# Patient Record
Sex: Male | Born: 2000 | Race: White | Hispanic: Yes | Marital: Single | Smoking: Current some day smoker
Health system: Southern US, Community
[De-identification: ages and names within clinical notes are randomized; demographics above are authoritative.]

---
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
## 2022-01-03 ENCOUNTER — Ambulatory Visit (HOSPITAL_COMMUNITY): Payer: Self-pay

## 2022-01-03 ENCOUNTER — Encounter (HOSPITAL_COMMUNITY): Payer: Self-pay | Admitting: *Deleted

## 2022-01-03 ENCOUNTER — Ambulatory Visit (HOSPITAL_COMMUNITY)
Admission: EM | Admit: 2022-01-03 | Discharge: 2022-01-03 | Disposition: A | Discharge status: Home / Self Care | Payer: Medicaid Other | Attending: Internal Medicine | Admitting: Internal Medicine

## 2022-01-03 ENCOUNTER — Other Ambulatory Visit: Payer: Self-pay

## 2022-01-03 DIAGNOSIS — S61431A Puncture wound without foreign body of right hand, initial encounter: Secondary | ICD-10-CM | POA: Diagnosis not present

## 2022-01-03 DIAGNOSIS — S6991XA Unspecified injury of right wrist, hand and finger(s), initial encounter: Secondary | ICD-10-CM | POA: Diagnosis not present

## 2022-01-03 DIAGNOSIS — Z23 Encounter for immunization: Secondary | ICD-10-CM | POA: Diagnosis not present

## 2022-01-03 MED ORDER — TETANUS-DIPHTH-ACELL PERTUSSIS 5-2.5-18.5 LF-MCG/0.5 IM SUSY
PREFILLED_SYRINGE | INTRAMUSCULAR | Status: AC
  Filled 2022-01-03: qty 0.5

## 2022-01-03 MED ORDER — TETANUS-DIPHTH-ACELL PERTUSSIS 5-2.5-18.5 LF-MCG/0.5 IM SUSY
0.5000 mL | PREFILLED_SYRINGE | Freq: Once | INTRAMUSCULAR | Status: AC
  Administered 2022-01-03: 0.5 mL via INTRAMUSCULAR

## 2022-01-03 NOTE — Discharge Instructions (Signed)
Monitor for signs of infection such as swelling, redness around the wound, and drainage from the wound. Keep the wound clean and dry to allow it to heal. We updated your tetanus shot today.  If you develop any new or worsening symptoms or do not improve in the next 2 to 3 days, please return.  If your symptoms are severe, please go to the emergency room.  Follow-up with your primary care provider for further evaluation and management of your symptoms as well as ongoing wellness visits.  I hope you feel better!

## 2022-01-03 NOTE — ED Provider Notes (Signed)
[facility]    CSN: [id number] Arrival date & time: 01/03/22  1021      History   Chief Complaint Chief Complaint  Patient presents with   Hand Injury    HPI Jose Cooper is a 21 y.o. male.   Patient presents urgent care for evaluation of puncture wound to the palm of the right hand that happened 2 days ago while he was at work.  He states that he was using a baby screwdriver when he accidentally struck the palm of his right hand causing a puncture wound.  He has full range of motion of the right hand and states the injury blood initially but bleeding was controlled with pressure.  Sensation intact distal to injury.  States the screwdriver was very rusty and would like an updated tetanus shot as he does not know when his last one was.  Denies previous injury to the right hand and foreign body to the right hand.  Denies signs of infection.  No fever or chills.     History reviewed. No pertinent past medical history.  There are no problems to display for this patient.   History reviewed. No pertinent surgical history.     Home Medications    Prior to Admission medications   Not on File    Family History History reviewed. No pertinent family history.  Social History Social History   Tobacco Use   Smoking status: Some Days    Types: Cigarettes   Smokeless tobacco: Never     Allergies   Patient has no known allergies.   Review of Systems Review of Systems Per HPI  Physical Exam Triage Vital Signs ED Triage Vitals  Enc Vitals Group     BP 01/03/22 1058 (!) 116/53     Pulse Rate 01/03/22 1058 65     Resp 01/03/22 1058 18     Temp 01/03/22 1058 98.7 F (37.1 C)     Temp src --      SpO2 01/03/22 1058 96 %     Weight --      Height --      Head Circumference --      Peak Flow --      Pain Score 01/03/22 1055 4     Pain Loc --      Pain Edu? --      Excl. in GC? --    No data found.  Updated Vital Signs BP (!) 116/53   Pulse  65   Temp 98.7 F (37.1 C)   Resp 18   SpO2 96%   Visual Acuity Right Eye Distance:   Left Eye Distance:   Bilateral Distance:    Right Eye Near:   Left Eye Near:    Bilateral Near:     Physical Exam Vitals and nursing note reviewed.  Constitutional:      Appearance: He is not ill-appearing or toxic-appearing.  HENT:     Head: Normocephalic and atraumatic.     Right Ear: Hearing and external ear normal.     Left Ear: Hearing and external ear normal.     Nose: Nose normal.     Mouth/Throat:     Lips: Pink.  Eyes:     General: Lids are normal. Vision grossly intact. Gaze aligned appropriately.     Extraocular Movements: Extraocular movements intact.     Conjunctiva/sclera: Conjunctivae normal.  Pulmonary:     Effort: Pulmonary effort is normal.  Musculoskeletal:  Cervical back: Neck supple.  Skin:    General: Skin is warm and dry.     Capillary Refill: Capillary refill takes less than 2 seconds.     Findings: No rash.     Comments: Puncture wound to the proximal palm of the right hand as seen in image below.  No warmth, erythema, underlying soft tissue swelling, or decreased range of motion.  Full range of motion of the right hand, 5/5 grip strength to the right hand, less than 2 capillary refill.  +2 right radial pulse.  Neurological:     General: No focal deficit present.     Mental Status: He is alert and oriented to person, place, and time. Mental status is at baseline.     Cranial Nerves: No dysarthria or facial asymmetry.  Psychiatric:        Mood and Affect: Mood normal.        Speech: Speech normal.        Behavior: Behavior normal.        Thought Content: Thought content normal.        Judgment: Judgment normal.      UC Treatments / Results  Labs (all labs ordered are listed, but only abnormal results are displayed) Labs Reviewed - No data to display  EKG   Radiology No results found.  Procedures Procedures (including critical care  time)  Medications Ordered in UC Medications  Tdap (BOOSTRIX) injection 0.5 mL (0.5 mLs Intramuscular Given 01/03/22 1119)    Initial Impression / Assessment and Plan / UC Course  I have reviewed the triage vital signs and the nursing notes.  Pertinent labs & imaging results that were available during my care of the patient were reviewed by me and considered in my medical decision making (see chart for details).   1.  Injury of right hand and puncture wound right hand Wound does not appear infected today.  Tdap updated.  ER and urgent care return precautions discussed.  Notification for imaging based on stable musculoskeletal exam findings.  He is neurovascularly intact distal to injury.  Discussed physical exam and available lab work findings in clinic with patient.  Counseled patient regarding appropriate use of medications and potential side effects for all medications recommended or prescribed today. Discussed red flag signs and symptoms of worsening condition,when to call the PCP office, return to urgent care, and when to seek higher level of care in the emergency department. Patient verbalizes understanding and agreement with plan. All questions answered. Patient discharged in stable condition.   Final Clinical Impressions(s) / UC Diagnoses   Final diagnoses:  Injury of right hand, initial encounter  Puncture wound of right hand without foreign body, initial encounter     Discharge Instructions      Monitor for signs of infection such as swelling, redness around the wound, and drainage from the wound. Keep the wound clean and dry to allow it to heal. We updated your tetanus shot today.  If you develop any new or worsening symptoms or do not improve in the next 2 to 3 days, please return.  If your symptoms are severe, please go to the emergency room.  Follow-up with your primary care provider for further evaluation and management of your symptoms as well as ongoing wellness  visits.  I hope you feel better!   ED Prescriptions   None    PDMP not reviewed this encounter.   Carlisle Beers, [place] 01/03/22 1124

## 2022-01-03 NOTE — ED Triage Notes (Signed)
Pt reports 2 days ago he cut his rt palm with a small screw driver. Small closed wound on palm of Rt hand. Pt reports he needs a tetanus booster .
